# Patient Record
Sex: Female | Born: 1980 | Race: Black or African American | Hispanic: No | Marital: Single | Smoking: Never smoker
Health system: Southern US, Community
[De-identification: ages and names within clinical notes are randomized; demographics above are authoritative.]

## PROBLEM LIST (undated history)

## (undated) HISTORY — PX: TUBAL LIGATION: SHX77

---

## 1999-07-24 ENCOUNTER — Other Ambulatory Visit: Admission: RE | Admit: 1999-07-24 | Discharge: 1999-07-24 | Payer: Self-pay | Admitting: Obstetrics and Gynecology

## 1999-12-15 ENCOUNTER — Emergency Department (HOSPITAL_COMMUNITY): Admission: EM | Admit: 1999-12-15 | Discharge: 1999-12-15 | Payer: Self-pay

## 2000-04-03 ENCOUNTER — Emergency Department (HOSPITAL_COMMUNITY): Admission: EM | Admit: 2000-04-03 | Discharge: 2000-04-03 | Payer: Self-pay | Admitting: Emergency Medicine

## 2000-04-03 ENCOUNTER — Encounter: Payer: Self-pay | Admitting: Emergency Medicine

## 2003-05-06 ENCOUNTER — Emergency Department (HOSPITAL_COMMUNITY): Admission: EM | Admit: 2003-05-06 | Discharge: 2003-05-06 | Payer: Self-pay | Admitting: Emergency Medicine

## 2003-07-13 ENCOUNTER — Ambulatory Visit (HOSPITAL_COMMUNITY): Admission: RE | Admit: 2003-07-13 | Discharge: 2003-07-13 | Payer: Self-pay | Admitting: Obstetrics and Gynecology

## 2003-10-17 ENCOUNTER — Ambulatory Visit (HOSPITAL_COMMUNITY): Admission: AD | Admit: 2003-10-17 | Discharge: 2003-10-17 | Payer: Self-pay | Admitting: Obstetrics and Gynecology

## 2004-01-26 ENCOUNTER — Inpatient Hospital Stay (HOSPITAL_COMMUNITY): Admission: RE | Admit: 2004-01-26 | Discharge: 2004-01-28 | Payer: Self-pay | Admitting: Obstetrics & Gynecology

## 2004-05-29 ENCOUNTER — Emergency Department (HOSPITAL_COMMUNITY): Admission: EM | Admit: 2004-05-29 | Discharge: 2004-05-29 | Payer: Self-pay | Admitting: Emergency Medicine

## 2005-10-05 ENCOUNTER — Ambulatory Visit (HOSPITAL_COMMUNITY): Admission: RE | Admit: 2005-10-05 | Discharge: 2005-10-05 | Payer: Self-pay | Admitting: Obstetrics

## 2005-10-20 ENCOUNTER — Emergency Department (HOSPITAL_COMMUNITY): Admission: EM | Admit: 2005-10-20 | Discharge: 2005-10-20 | Payer: Self-pay | Admitting: *Deleted

## 2005-10-20 ENCOUNTER — Emergency Department (HOSPITAL_COMMUNITY): Admission: EM | Admit: 2005-10-20 | Discharge: 2005-10-20 | Payer: Self-pay | Admitting: Emergency Medicine

## 2006-02-27 ENCOUNTER — Inpatient Hospital Stay (HOSPITAL_COMMUNITY): Admission: AD | Admit: 2006-02-27 | Discharge: 2006-03-02 | Payer: Self-pay | Admitting: Obstetrics

## 2006-10-13 ENCOUNTER — Emergency Department (HOSPITAL_COMMUNITY): Admission: EM | Admit: 2006-10-13 | Discharge: 2006-10-13 | Payer: Self-pay | Admitting: Emergency Medicine

## 2007-02-22 ENCOUNTER — Other Ambulatory Visit: Admission: RE | Admit: 2007-02-22 | Discharge: 2007-02-22 | Payer: Self-pay | Admitting: Obstetrics and Gynecology

## 2007-07-15 ENCOUNTER — Encounter: Payer: Self-pay | Admitting: Obstetrics and Gynecology

## 2007-07-15 ENCOUNTER — Ambulatory Visit: Payer: Self-pay | Admitting: Obstetrics and Gynecology

## 2007-07-15 ENCOUNTER — Inpatient Hospital Stay (HOSPITAL_COMMUNITY): Admission: AD | Admit: 2007-07-15 | Discharge: 2007-07-17 | Payer: Self-pay | Admitting: Obstetrics and Gynecology

## 2007-09-07 ENCOUNTER — Ambulatory Visit (HOSPITAL_COMMUNITY): Admission: RE | Admit: 2007-09-07 | Discharge: 2007-09-07 | Payer: Self-pay | Admitting: Obstetrics & Gynecology

## 2007-12-17 ENCOUNTER — Emergency Department (HOSPITAL_COMMUNITY): Admission: EM | Admit: 2007-12-17 | Discharge: 2007-12-17 | Payer: Self-pay | Admitting: Emergency Medicine

## 2009-09-28 IMAGING — CR DG HIP COMPLETE 2+V*R*
3 series · 3 of 3 positions shown · non-contrast
Comparison: none

CLINICAL DATA: fall

RIGHT HIP - COMPLETE 2+ VIEW

[view not recorded (1 of 3)]
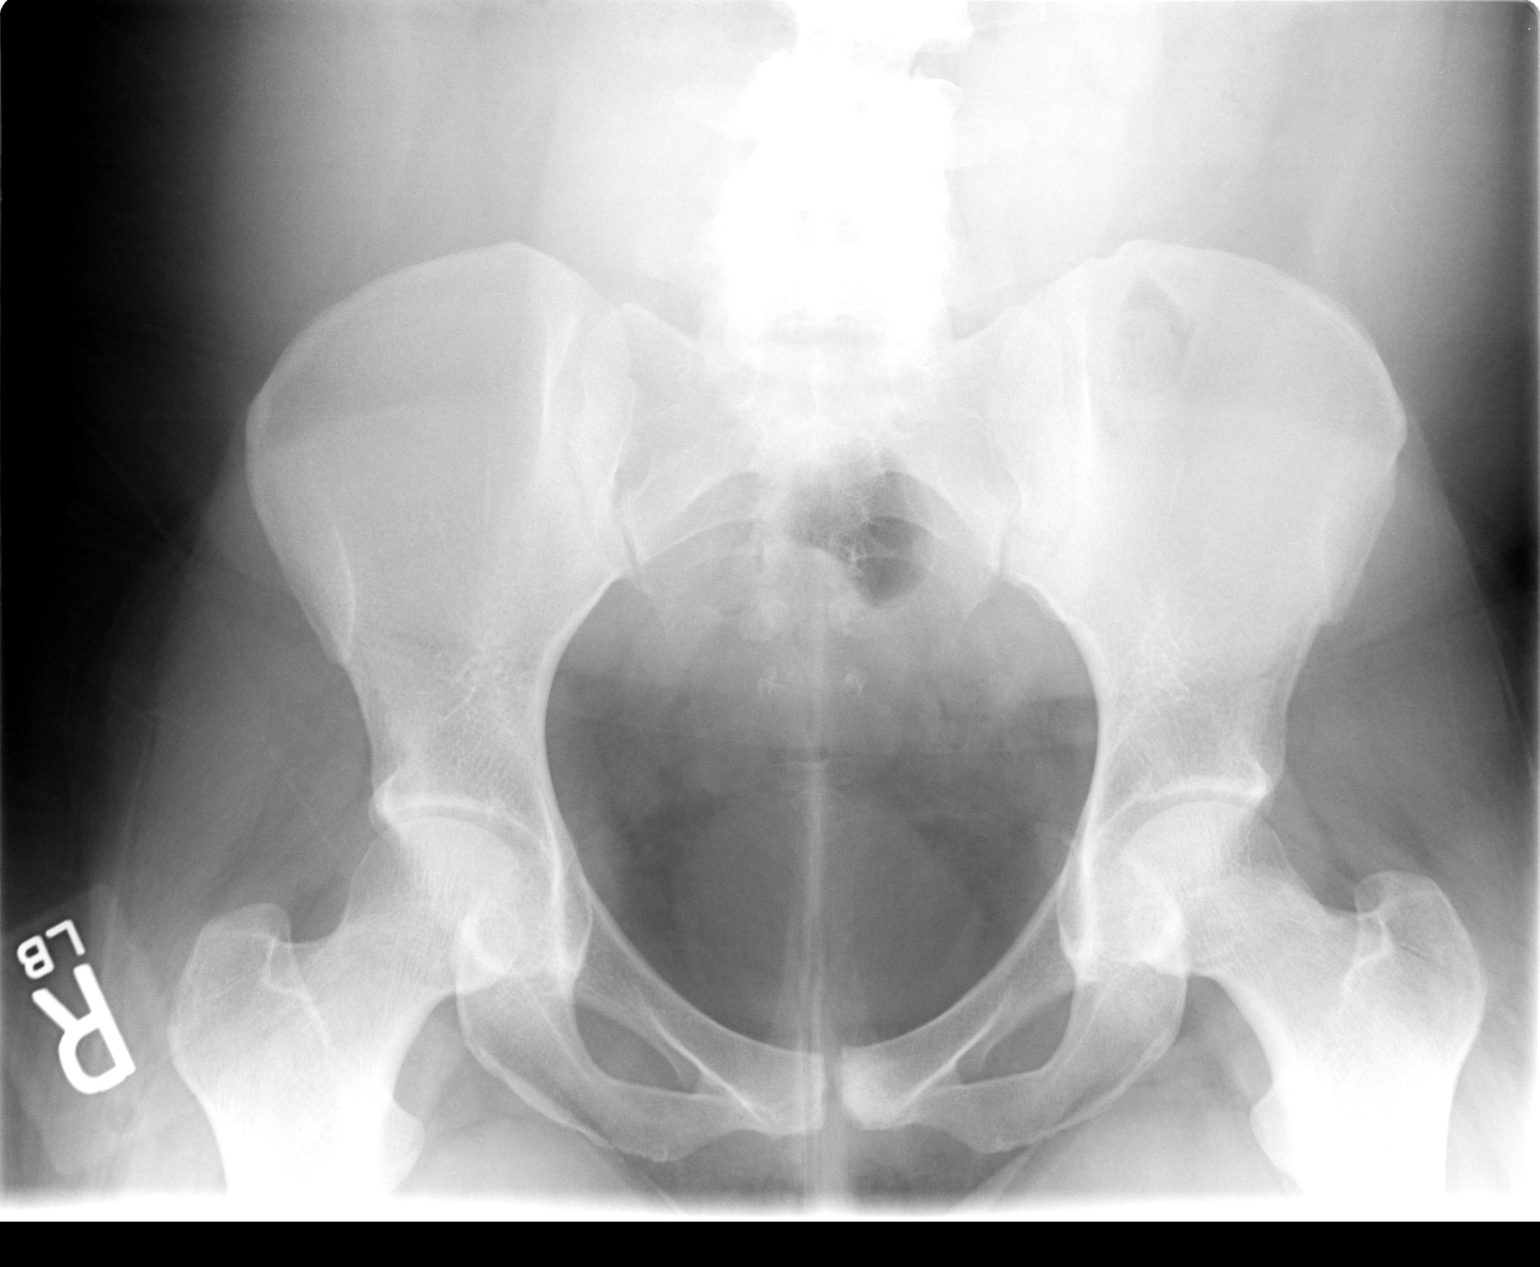

[view not recorded (2 of 3)]
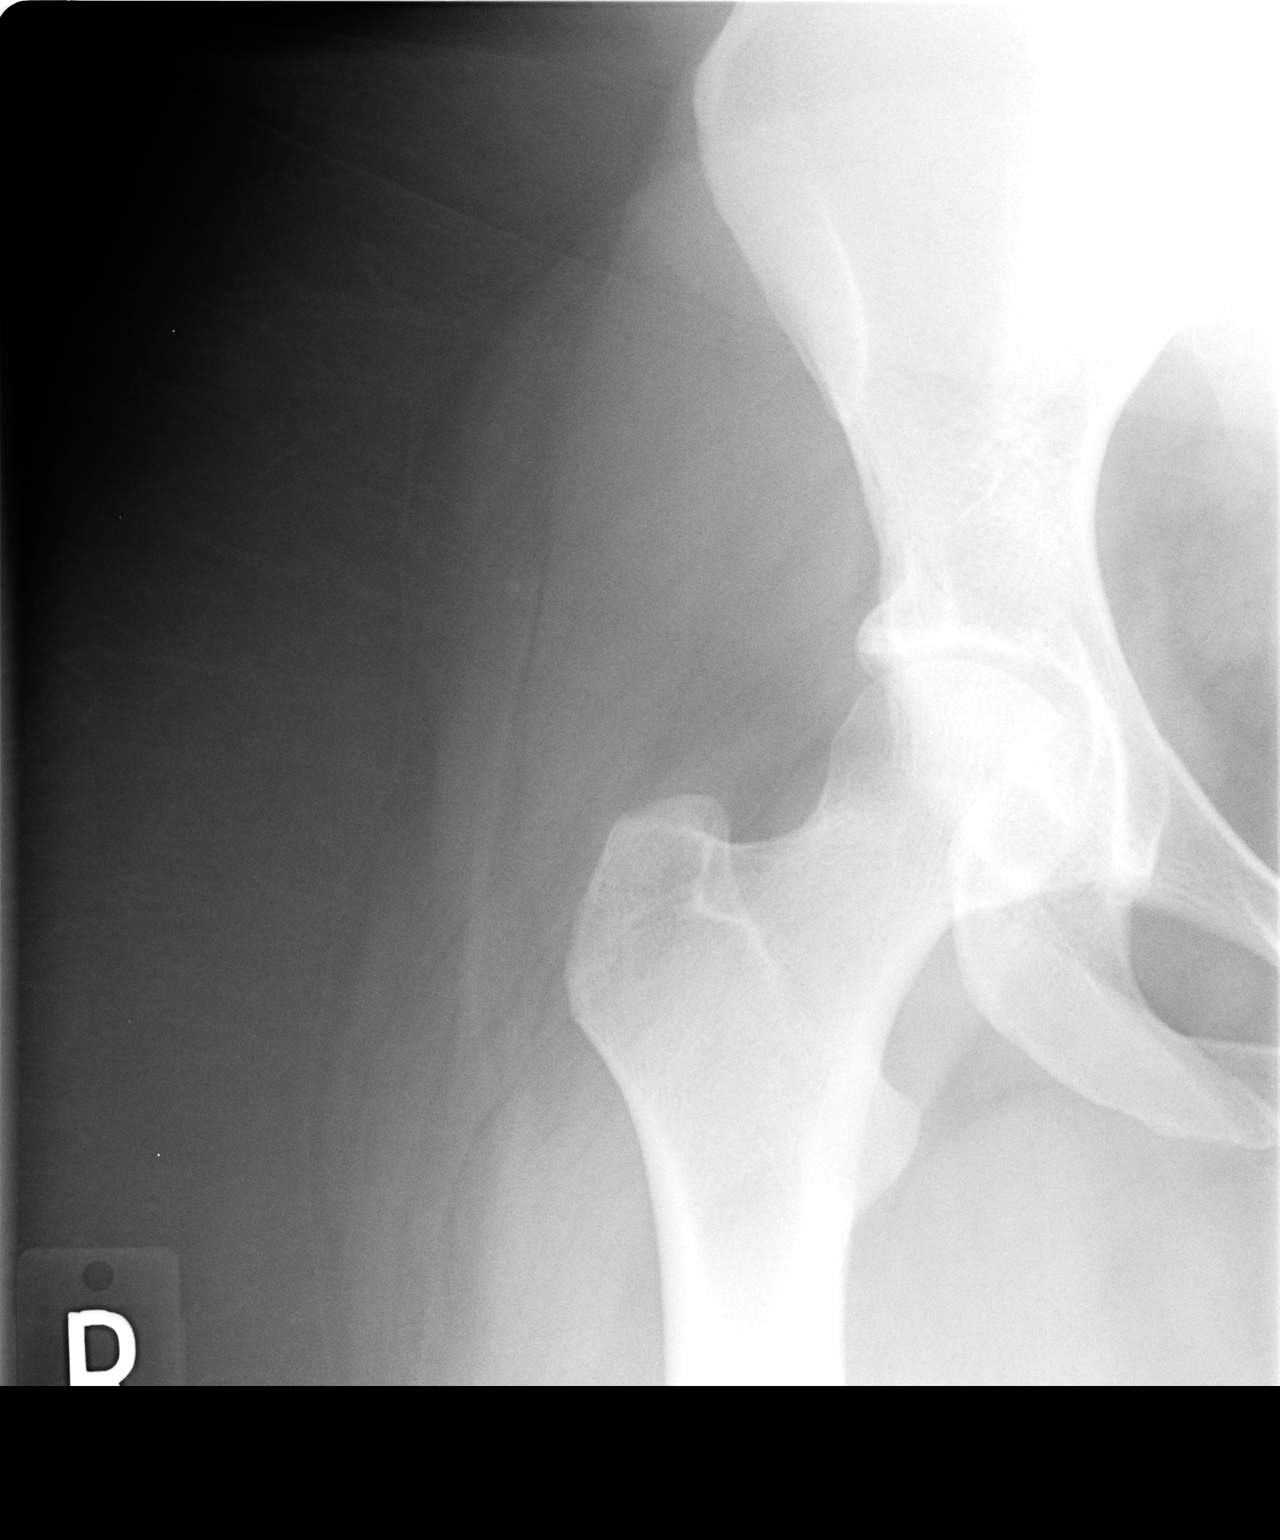

[view not recorded (3 of 3)]
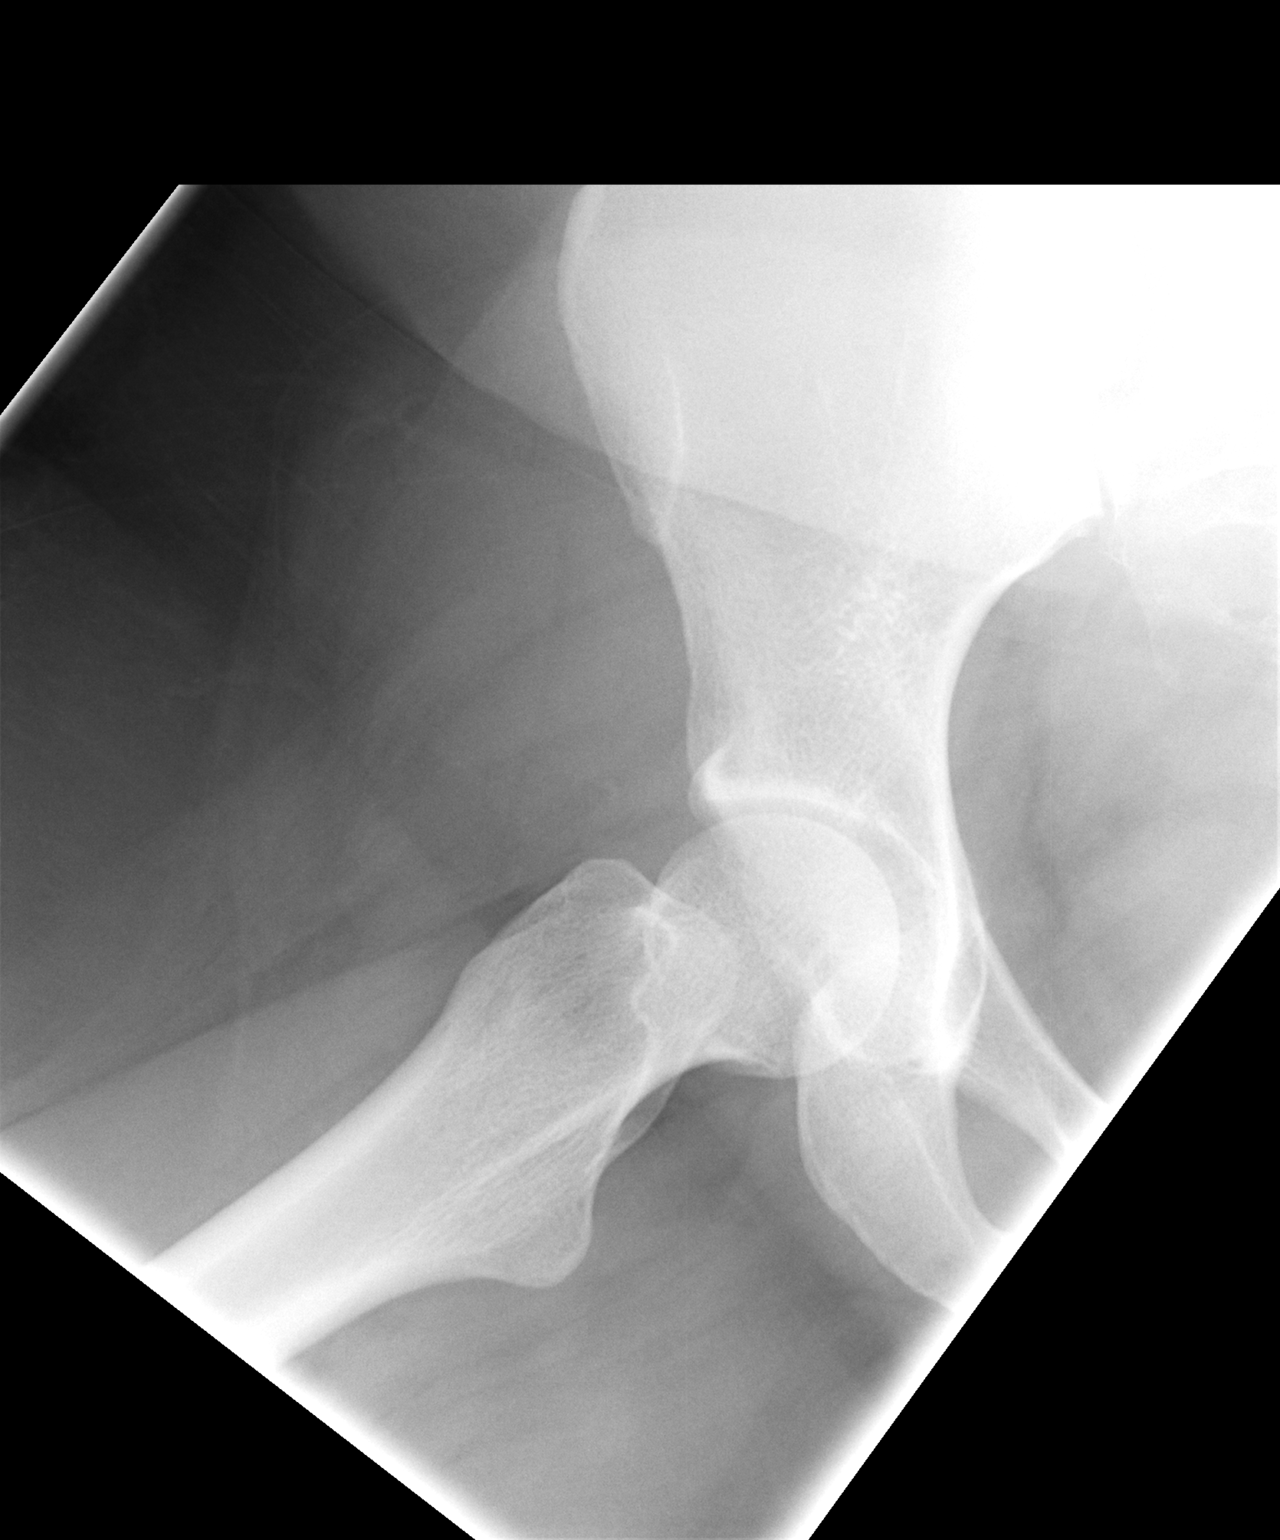

[3 of 3 positions shown; findings below may reference images not displayed]

FINDINGS: There is no evidence of hip fracture or dislocation.
There is no evidence of arthropathy or other focal bone
abnormality.
IMPRESSION: Negative.

## 2010-04-12 ENCOUNTER — Emergency Department (HOSPITAL_COMMUNITY)
Admission: EM | Admit: 2010-04-12 | Discharge: 2010-04-12 | Payer: Self-pay | Source: Home / Self Care | Admitting: Emergency Medicine

## 2010-09-09 NOTE — Op Note (Signed)
Jennifer Reilly, Jennifer Reilly               ACCOUNT NO.:  [id number]   MEDICAL RECORD NO.:  [id number]          PATIENT TYPE:  AMB   LOCATION:  DAY                           FACILITY:  [facility]   PHYSICIAN:  Lazaro Arms, M.D.   DATE OF BIRTH:  02-10-1981   DATE OF PROCEDURE:  09/07/2007  DATE OF DISCHARGE:                               OPERATIVE REPORT   PREOPERATIVE DIAGNOSIS:  Multiparous female desiring permanent  sterilization.   POSTOPERATIVE DIAGNOSIS:  Multiparous female desiring permanent  sterilization.   PROCEDURE:  Laparoscopic tubal ligation.   SURGEON:  Lazaro Arms, MD   ANESTHESIA:  General endotracheal.   FINDINGS:  The patient had normal uterus, tubes, and ovaries.   DESCRIPTION OF OPERATION:  The patient was taken to the operating room,  placed in supine position where she underwent general endotracheal  anesthesia.  She was placed then in the lithotomy position, prepped and  draped in the usual sterile fashion.  A Hulka tenaculum was placed in  the uterus for uterine manipulation.  Incision was made in the  umbilicus.  A Veress needle was employed.  Peritoneal cavity was  insufflated without difficulty.  A 10/11 nonbladed direct visualization  trocar was used and the distal grip of the trocar was placed in the  peritoneal cavity at one pass without difficulty.  The peritoneal cavity  was confirmed.  Fallopian tubes were identified.  They were burned  bilaterally.  No resistance and beyond in the distal isthmic ampullary  region of the tube approximately 3 cm segment bilaterally.  There was  good hemostasis.  There were no other abnormalities of the uterus,  tubes, or ovaries.   The instruments were removed.  The gas was allowed to escape.  The  fascia was closed using 0 Vicryl.  A subcutaneous 0 Vicryl was then  placed and the skin was closed using skin staples.  The patient  tolerated the procedure well.  She experienced no blood loss and was  taken to recovery  room in good stable condition.  All counts were  correct x3.      Lazaro Arms, M.D.  Electronically Signed     LHE/MEDQ  D:  09/07/2007  T:  09/08/2007  Job:  [id number]

## 2010-09-09 NOTE — H&P (Signed)
NAMEKOMAL, STANGELO               ACCOUNT NO.:  [id number]   MEDICAL RECORD NO.:  [id number]          PATIENT TYPE:  INP   LOCATION:  9146                          FACILITY:  [facility]   PHYSICIAN:  Lazaro Arms, M.D.   DATE OF BIRTH:  Sep 17, 1980   DATE OF ADMISSION:  07/15/2007  DATE OF DISCHARGE:                              HISTORY & PHYSICAL   Jennifer Reilly is a 30 year old African-American female, gravida 6, para 3-0-2-3  with twins, estimated date of delivery of August 13, 2007.  She presented  to the maternity admissions unit complaining of rupture of membranes and  labor.  She was found to be 7 cm in full breech presentation.  Ironically, I had seen her in the office earlier today, and she was  having no problems at all, no contractions, no nothing, and we scheduled  her for a cesarean section on April 2, along with a tubal ligation.  However she, of course, had other plans.  She was then taken immediately  to labor and delivery, where she had an IV started and labs drawn, and  preparations were made for immediate C-section.  Please refer to my  delivery note for details of that.   PAST MEDICAL HISTORY:  Negative.   PAST SURGICAL HISTORY:  Negative.   PAST OB HISTORY:  Two pregnancy losses and three term deliveries, 6  pounds 4 ounces, 6 pounds 7 ounces, 7 pounds 12 ounces.  This pregnancy  had been complicated by the fact that she had twins but otherwise was  relatively unremarkable.  Blood type was A positive.  Rubella is immune.  Sickle cell was negative.  RPR was negative x2.  Hepatitis B was  negative.  HIV was negative.  Glucola was normal.  Drug screen was  negative.  Pap was negative.  GC and Chlamydia were negative x2.  Group  B strep was pending.   PHYSICAL EXAMINATION:  HEENT:  Unremarkable.  Thyroid is normal.  LUNGS:  Clear.  HEART:  Regular rhythm without murmurs, rubs or gallops.  BREASTS:  Deferred.  ABDOMEN:  Fundal height in the office earlier today was 46 cm.   Cervix  again 7 cm with feet in the vagina.  EXTREMITIES:  Warm with no edema.  NEUROLOGIC:  Grossly intact.   IMPRESSION:  1. Intrauterine pregnancy with twins at 36-1/[redacted] weeks gestation.  2. Rupture of membranes with labor.   PLAN:  We will proceed immediately with cesarean section and tubal  ligation as planned.     Lazaro Arms, M.D.  Electronically Signed    LHE/MEDQ  D:  07/15/2007  T:  07/15/2007  Job:  [id number]

## 2010-09-09 NOTE — Op Note (Signed)
[name], [name]               ACCOUNT NO.:  [id number]   MEDICAL RECORD NO.:  [id number]          PATIENT TYPE:  INP   LOCATION:  9146                          FACILITY:  [facility]   PHYSICIAN:  Lazaro Arms, M.D.   DATE OF BIRTH:  [birth year]   DATE OF PROCEDURE:  07/15/2007  DATE OF DISCHARGE:                               OPERATIVE REPORT   HISTORY:  Jennifer Reilly is a 30 year old African American female gravida 6,  para 3-0-2-3 with twin gestation, estimated date of delivery of August 13, 2007, currently at 19 and 3 days weeks gestation who presented to  the maternity admissions unit complaining of ruptured membranes while  she was getting her nails done.  She then presented to the maternity  admissions unit and was evaluated by Sherril Cong, CNM and she was  found to be indeed ruptured, 7 cm with a double footling breech  presentation.  We then made rapid preparations to proceed with a C-  section.  I had actually scheduled her for a C-section when I saw her in  the office earlier today for April 2 because she was breech transverse  twins.  We quickly brought her back to the operating room but she had a  tremendous amount of pressure, was examined and felt the strong urge to  push and so went ahead and did a double footling breech delivery of twin  A.  She was a female at 2000 hours with Apgars of 8 and 9 with a weight  determined in the nursery.  The cord gas was 7.39.  I then used the  ultrasound and found the heart rate of twin B.  Twin B had a heart rate  around 100 and it quickly came up to the 130s.  When she had a  contraction I did an amniotomy and twin B was in vertex presentation.  I  delivered twin B which was a female at 2008 hours with Apgars of 9 and 10  and cord gas of 7.30.  His weight will also be determined in the  nursery.  Dr. Alison Murray was in attendance for the delivery of twin B and  was there shortly after the delivery of twin A.  All this occurred in  the operating  room in room 1 with the OR staff in place as we were  making preparations for a C-section.  Placentas were fused.  They were  delivered spontaneously at 2015 hours intact and they were sent to  pathology for evaluation.  Cord blood was also sent on both twins.  The  perineum was intact.  The uterus was firm below the umbilicus and  bleeding was minimal for the delivery.  The blood type is A positive,  antibody is negative, rubella is immune, the rest of the prenatal labs  were normal.   Deaisha wants to have a tubal ligation and I will see if her papers have  been signed long enough, I believe they have.  If they are then we will  do that on Sunday.  Otherwise she will undergo routine postpartum care.  Lazaro Arms, M.D.  Electronically Signed     LHE/MEDQ  D:  07/15/2007  T:  07/16/2007  Job:  [id number]

## 2010-09-12 NOTE — Op Note (Signed)
Jennifer Reilly, Jennifer Reilly               ACCOUNT NO.:  [id number]   MEDICAL RECORD NO.:  [id number]          PATIENT TYPE:  INP   LOCATION:  A401                          FACILITY:  [facility]   PHYSICIAN:  Lazaro Arms, M.D.   DATE OF BIRTH:  1980-09-24   DATE OF PROCEDURE:  01/26/2004  DATE OF DISCHARGE:                                 OPERATIVE REPORT   DELIVERY NOTE:  The patient progressed nicely through the active phase of  labor.  She delivered over an intact perineum a viable female at 1435 hours,  with Apgars of 9 and 9, with the weight to be determined.  The cord blood  and cord gas were sent.  There was a three-vessel cord.  The placenta was  normal and intact.  The uterus was firm and below the umbilicus.  There was  a second-degree perineal laceration which was repaired without difficulty.  The blood loss for the delivery is minimal.   PLAN:  She will undergo routine post-partum care.      LHE/MEDQ  D:  01/26/2004  T:  01/27/2004  Job:  [id number]

## 2010-09-12 NOTE — H&P (Signed)
[name], Reilly               ACCOUNT NO.:  [id number]   MEDICAL RECORD NO.:  [id number]          PATIENT TYPE:  INP   LOCATION:  LDR2                          FACILITY:  [facility]   PHYSICIAN:  Lazaro Arms, M.D.   DATE OF BIRTH:  03-12-1981   DATE OF ADMISSION:  01/26/2004  DATE OF DISCHARGE:  LH                                HISTORY & PHYSICAL   HISTORY OF PRESENT ILLNESS:  Jennifer Reilly is a 30 year old African-American  female, gravida 3, para 1, abortus 1, with estimated date of delivery of  February 10, 2004, currently at 37-6/[redacted] weeks gestation, who is admitted, came  into labor and delivery complaining of contractions.  Her cervix was 2 to 3  cm, 50%, -2.  She was left in labor and delivery and she has progressed now  to 3 to 4, 50%, and -2.  She is having pretty irregular contractions.  Amniotomy is performed.  Pitocin augmentation is begun.  The patient is  positive for herpes simplex virus 2 antibodies.  Saw her yesterday in the  office.  She is having no symptoms, nor has she ever had any symptoms, and  during the pregnancy she has been taking Valtrex as suppression at 500 mg a  day.   PAST MEDICAL HISTORY:  Negative except for migraines.   PAST SURGICAL HISTORY:  Negative.   PAST OBSTETRICAL HISTORY:  1.  She had a termination in August 2004.  2.  She had a vaginal delivery in 1995, a 6 pound 4 ounce baby in      [place].   REVIEW OF SYSTEMS:  Otherwise negative.   ALLERGIES:  No known drug allergies.   LABORATORY DATA:  Blood type is A positive.  Drug screen was negative.  Antibody screen was negative.  Rubella is immune.  Hepatitis B was negative.  HIV was negative.  Serologies non-reactive.  Her Pap was normal.  GC and  Chlamydia were negative.  Her AFP was normal.  Group B Strep was negative.  Her Glucola was 88.  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   PHYSICAL EXAMINATION:  HEENT:  Unremarkable.  NECK:  Thyroid is normal.  LUNGS:  Clear.  HEART:  Regular rate and rhythm without murmurs, rubs, or gallops.  BREASTS:  Deferred.  ABDOMEN:  Fundal height in the office was 38 cm.  PELVIC:  Cervix 3 to 4, 50, -2.  EXTREMITIES:  Warm with 1+ edema.   IMPRESSION:  1.  Intrauterine pregnancy at 37-6/[redacted] weeks gestation.  2.  Early active labor.   PLAN:  The patient is admitted for labor management.  Amniotomy is performed  with clear fluid.  Scalp electrode is placed.  She will be begun on Pitocin  augmentation for labor.      LHE/MEDQ  D:  01/26/2004  T:  01/26/2004  Job:  [id number]

## 2011-01-19 LAB — CBC
HCT: 29.8 — ABNORMAL LOW
MCHC: 32.3
MCV: 71.1 — ABNORMAL LOW
Platelets: 151
RBC: 4.19
WBC: 7.8

## 2013-05-04 ENCOUNTER — Encounter (HOSPITAL_COMMUNITY): Payer: Self-pay | Admitting: Emergency Medicine

## 2013-05-04 ENCOUNTER — Emergency Department (HOSPITAL_COMMUNITY)
Admission: EM | Admit: 2013-05-04 | Discharge: 2013-05-04 | Disposition: A | Discharge status: Home / Self Care | Payer: Managed Care, Other (non HMO) | Attending: Emergency Medicine | Admitting: Emergency Medicine

## 2013-05-04 DIAGNOSIS — J3489 Other specified disorders of nose and nasal sinuses: Secondary | ICD-10-CM | POA: Insufficient documentation

## 2013-05-04 DIAGNOSIS — J020 Streptococcal pharyngitis: Secondary | ICD-10-CM | POA: Insufficient documentation

## 2013-05-04 DIAGNOSIS — R0982 Postnasal drip: Secondary | ICD-10-CM | POA: Insufficient documentation

## 2013-05-04 LAB — RAPID STREP SCREEN (MED CTR MEBANE ONLY): STREPTOCOCCUS, GROUP A SCREEN (DIRECT): NEGATIVE

## 2013-05-04 MED ORDER — PENICILLIN G BENZATHINE 1200000 UNIT/2ML IM SUSP
1.2000 10*6.[IU] | Freq: Once | INTRAMUSCULAR | Status: AC
  Administered 2013-05-04: 1.2 10*6.[IU] via INTRAMUSCULAR
  Filled 2013-05-04: qty 2

## 2013-05-04 MED ORDER — DEXAMETHASONE SODIUM PHOSPHATE 10 MG/ML IJ SOLN
10.0000 mg | Freq: Once | INTRAMUSCULAR | Status: AC
  Administered 2013-05-04: 10 mg via INTRAMUSCULAR
  Filled 2013-05-04: qty 1

## 2013-05-04 NOTE — ED Notes (Signed)
Vital signs stable. 

## 2013-05-04 NOTE — ED Provider Notes (Signed)
CSN: [id number]     Arrival date & time 05/04/13  0449 History   First MD Initiated Contact with Patient 05/04/13 0601     Chief Complaint  Patient presents with  . Sore Throat  . Nasal Congestion   (Consider location/radiation/quality/duration/timing/severity/associated sxs/prior Treatment) Patient is a 33 y.o. female presenting with pharyngitis. The history is provided by the patient and medical records.  Sore Throat Associated symptoms include a sore throat.   This is a 33 y.o. F with no significant PMH presenting to the ED for sore throat, nasal congestion, rhinorrhea, and painful swallowing x 4 days.  Pt denies fever, sweats, or chills.  No recent sick contacts with similar sx.  No difficulty swallowing or breathing.  Pt states she has a hx of strep throat with similar sx.  VS stable on arrival.  History reviewed. No pertinent past medical history. Past Surgical History  Procedure Laterality Date  . Tubal ligation     History reviewed. No pertinent family history. History  Substance Use Topics  . Smoking status: Never Smoker   . Smokeless tobacco: Not on file  . Alcohol Use: Yes     Comment: social   OB History   Grav Para Term Preterm Abortions TAB SAB Ect Mult Living                 Review of Systems  HENT: Positive for postnasal drip and sore throat.   All other systems reviewed and are negative.    Allergies  Review of patient's allergies indicates no known allergies.  Home Medications   Current Outpatient Rx  Name  Route  Sig  Dispense  Refill  . Chlorphen-Pseudoephed-APAP (THERAFLU FLU/COLD/SORE THROAT PO)   Oral   Take 1 Package by mouth every 4 (four) hours as needed (cold).         . Pseudoephedrine-APAP-DM (DAYQUIL MULTI-SYMPTOM COLD/FLU PO)   Oral   Take 2 capsules by mouth every 4 (four) hours as needed (cold).          BP 129/77  Temp(Src) 98 F (36.7 C) (Oral)  Resp 20  SpO2 95%  LMP 04/09/2013  Physical Exam  Nursing note and  vitals reviewed. Constitutional: She is oriented to person, place, and time. She appears well-developed and well-nourished. No distress.  HENT:  Head: Normocephalic and atraumatic.  Right Ear: Tympanic membrane and ear canal normal.  Left Ear: Tympanic membrane and ear canal normal.  Nose: Mucosal edema present.  Mouth/Throat: Uvula is midline and mucous membranes are normal. No trismus in the jaw. No uvula swelling. Posterior oropharyngeal erythema present. No oropharyngeal exudate, posterior oropharyngeal edema or tonsillar abscesses.  Turbinates swollen and erythematous; tonsils 2+ bilaterally without exudate; uvula midline; no peritonsillar abscess; handling secretions appropriately; no difficulty swallowing  Eyes: Conjunctivae and EOM are normal. Pupils are equal, round, and reactive to light.  Neck: Normal range of motion. Neck supple.  Cardiovascular: Normal rate, regular rhythm and normal heart sounds.   Pulmonary/Chest: Effort normal and breath sounds normal. No respiratory distress. She has no wheezes.  Musculoskeletal: Normal range of motion.  Lymphadenopathy:    She has cervical adenopathy.  Neurological: She is alert and oriented to person, place, and time.  Skin: Skin is warm and dry. She is not diaphoretic.  Psychiatric: She has a normal mood and affect.    ED Course  Procedures (including critical care time) Labs Review Labs Reviewed  RAPID STREP SCREEN  CULTURE, GROUP A STREP   Imaging Review  No results found.  EKG Interpretation   None       MDM   1. Strep pharyngitis    Rapid strep negative, however sx and PE findings consistent with strep.  Uvula remains midline, no signs of peritonsillar abscess.  Will tx with bicillin and decadron in the ED.  Return precautions advised for new or worsening symptoms.  Garlon HatchetLisa M Mauriah Mcmillen, PA-C 05/04/13 [phone]

## 2013-05-04 NOTE — Discharge Instructions (Signed)
You have been treated for strep pharyngitis.  Your symptoms should start to improve in the next 24-48 hours. Return to the ED for new or worsening symptoms.

## 2013-05-04 NOTE — ED Notes (Signed)
Pt c/o sore throat, nasal congestion and draining, and increase pain with swallowing x4 days. Redness noted to throat

## 2013-05-04 NOTE — ED Notes (Signed)
Pt c/o sore throat, nasal congestion and pain with swallowing x4 days. Pt denies cough, congestion, fever, or chills

## 2013-05-05 LAB — CULTURE, GROUP A STREP

## 2013-05-05 NOTE — ED Provider Notes (Signed)
Medical screening examination/treatment/procedure(s) were performed by non-physician practitioner and as supervising physician I was immediately available for consultation/collaboration.  EKG Interpretation   None         Loren Raceravid Briselda Naval, MD 05/05/13 [id number]

## 2013-05-06 ENCOUNTER — Emergency Department (HOSPITAL_COMMUNITY)
Admission: EM | Admit: 2013-05-06 | Discharge: 2013-05-06 | Disposition: A | Discharge status: Home / Self Care | Payer: Managed Care, Other (non HMO) | Attending: Emergency Medicine | Admitting: Emergency Medicine

## 2013-05-06 ENCOUNTER — Encounter (HOSPITAL_COMMUNITY): Payer: Self-pay | Admitting: Emergency Medicine

## 2013-05-06 DIAGNOSIS — J069 Acute upper respiratory infection, unspecified: Secondary | ICD-10-CM | POA: Insufficient documentation

## 2013-05-06 DIAGNOSIS — R5381 Other malaise: Secondary | ICD-10-CM | POA: Insufficient documentation

## 2013-05-06 DIAGNOSIS — R5383 Other fatigue: Secondary | ICD-10-CM

## 2013-05-06 DIAGNOSIS — J029 Acute pharyngitis, unspecified: Secondary | ICD-10-CM

## 2013-05-06 MED ORDER — PREDNISONE 20 MG PO TABS
60.0000 mg | ORAL_TABLET | Freq: Once | ORAL | Status: AC
  Administered 2013-05-06: 60 mg via ORAL
  Filled 2013-05-06: qty 3

## 2013-05-06 MED ORDER — LIDOCAINE VISCOUS 2 % MT SOLN
15.0000 mL | Freq: Once | OROMUCOSAL | Status: AC
  Administered 2013-05-06: 15 mL via OROMUCOSAL
  Filled 2013-05-06: qty 15

## 2013-05-06 MED ORDER — DIPHENHYDRAMINE HCL 12.5 MG/5ML PO ELIX
25.0000 mg | ORAL_SOLUTION | Freq: Once | ORAL | Status: AC
  Administered 2013-05-06: 25 mg via ORAL
  Filled 2013-05-06: qty 10

## 2013-05-06 MED ORDER — GUAIFENESIN ER 600 MG PO TB12: 1200.0000 mg | ORAL_TABLET | Freq: Two times a day (BID) | ORAL | Status: AC

## 2013-05-06 MED ORDER — CETIRIZINE-PSEUDOEPHEDRINE ER 5-120 MG PO TB12: 1.0000 | ORAL_TABLET | Freq: Two times a day (BID) | ORAL | Status: AC

## 2013-05-06 NOTE — Discharge Instructions (Signed)
Use frequent salt water gargles to help with sore throat symptoms. Use other medications prescribed to help with congestion, sore throat and cough. Follow up with a primary care provider for continued evaluation and treatment.    Pharyngitis Pharyngitis is redness, pain, and swelling (inflammation) of your pharynx.  CAUSES  Pharyngitis is usually caused by infection. Most of the time, these infections are from viruses (viral) and are part of a cold. However, sometimes pharyngitis is caused by bacteria (bacterial). Pharyngitis can also be caused by allergies. Viral pharyngitis may be spread from person to person by coughing, sneezing, and personal items or utensils (cups, forks, spoons, toothbrushes). Bacterial pharyngitis may be spread from person to person by more intimate contact, such as kissing.  SIGNS AND SYMPTOMS  Symptoms of pharyngitis include:   Sore throat.   Tiredness (fatigue).   Low-grade fever.   Headache.  Joint pain and muscle aches.  Skin rashes.  Swollen lymph nodes.  Plaque-like film on throat or tonsils (often seen with bacterial pharyngitis). DIAGNOSIS  Your health care provider will ask you questions about your illness and your symptoms. Your medical history, along with a physical exam, is often all that is needed to diagnose pharyngitis. Sometimes, a rapid strep test is done. Other lab tests may also be done, depending on the suspected cause.  TREATMENT  Viral pharyngitis will usually get better in 3 4 days without the use of medicine. Bacterial pharyngitis is treated with medicines that kill germs (antibiotics).  HOME CARE INSTRUCTIONS   Drink enough water and fluids to keep your urine clear or pale yellow.   Only take over-the-counter or prescription medicines as directed by your health care provider:   If you are prescribed antibiotics, make sure you finish them even if you start to feel better.   Do not take aspirin.   Get lots of rest.    Gargle with 8 oz of salt water ( tsp of salt per 1 qt of water) as often as every 1 2 hours to soothe your throat.   Throat lozenges (if you are not at risk for choking) or sprays may be used to soothe your throat. SEEK MEDICAL CARE IF:   You have large, tender lumps in your neck.  You have a rash.  You cough up green, yellow-brown, or bloody spit. SEEK IMMEDIATE MEDICAL CARE IF:   Your neck becomes stiff.  You drool or are unable to swallow liquids.  You vomit or are unable to keep medicines or liquids down.  You have severe pain that does not go away with the use of recommended medicines.  You have trouble breathing (not caused by a stuffy nose). MAKE SURE YOU:   Understand these instructions.  Will watch your condition.  Will get help right away if you are not doing well or get worse. Document Released: 04/13/2005 Document Revised: 02/01/2013 Document Reviewed: 12/19/2012 [facility] Patient Information 2014 [place], [place].   Salt Water Gargle This solution will help make your mouth and throat feel better. HOME CARE INSTRUCTIONS   Mix 1 teaspoon of salt in 8 ounces of warm water.  Gargle with this solution as much or often as you need or as directed. Swish and gargle gently if you have any sores or wounds in your mouth.  Do not swallow this mixture. Document Released: 01/16/2004 Document Revised: 07/06/2011 Document Reviewed: 06/08/2008 [facility] Patient Information 2014 [place], [place].    Upper Respiratory Infection, Adult An upper respiratory infection (URI) is also known as the  common cold. It is often caused by a type of germ (virus). Colds are easily spread (contagious). You can pass it to others by kissing, coughing, sneezing, or drinking out of the same glass. Usually, you get better in 1 or 2 weeks.  HOME CARE   Only take medicine as told by your doctor.  Use a warm mist humidifier or breathe in steam from a hot shower.  Drink enough water and  fluids to keep your pee (urine) clear or pale yellow.  Get plenty of rest.  Return to work when your temperature is back to normal or as told by your doctor. You may use a face mask and wash your hands to stop your cold from spreading. GET HELP RIGHT AWAY IF:   After the first few days, you feel you are getting worse.  You have questions about your medicine.  You have chills, shortness of breath, or brown or red spit (mucus).  You have yellow or brown snot (nasal discharge) or pain in the face, especially when you bend forward.  You have a fever, puffy (swollen) neck, pain when you swallow, or white spots in the back of your throat.  You have a bad headache, ear pain, sinus pain, or chest pain.  You have a high-pitched whistling sound when you breathe in and out (wheezing).  You have a lasting cough or cough up blood.  You have sore muscles or a stiff neck. MAKE SURE YOU:   Understand these instructions.  Will watch your condition.  Will get help right away if you are not doing well or get worse. Document Released: 09/30/2007 Document Revised: 07/06/2011 Document Reviewed: 08/18/2010 [facility] Patient Information 2014 [place].

## 2013-05-06 NOTE — ED Provider Notes (Signed)
Medical screening examination/treatment/procedure(s) were performed by non-physician practitioner and as supervising physician I was immediately available for consultation/collaboration.  EKG Interpretation   None         Charles B. Bernette MayersSheldon, MD 05/06/13 2352

## 2013-05-06 NOTE — ED Notes (Signed)
Pt reports severe sore throat for several days, having right ear pain and congestion. Was seen here two days ago and diagnosed with strep, now having increase in pain, hoarseness, neck pain. Is able to swallow liquids but difficult to pain. Airway is intact at triage.

## 2013-05-06 NOTE — ED Provider Notes (Signed)
CSN: [id number]     Arrival date & time 05/06/13  1759 History   First MD Initiated Contact with Patient 05/06/13 2152     Chief Complaint  Patient presents with  . Sore Throat   HPI  History provided by the patient and recent medical chart. Patient is a 33 year old female with no significant PMH presenting with continued worsened sore throat. Patient reports having nasal congestion, rhinorrhea, sore throat for the past 6 days. She was seen 2 days ago in the emergency department with negative strep test and negative throat culture. She was given IM dose of Bicillin for possible strep throat. She has not noticed any change or improvement in symptoms since that time. She has not used any other medications at home.  No other treatments used.  Denies any associated fever, chills or sweats.    History reviewed. No pertinent past medical history. Past Surgical History  Procedure Laterality Date  . Tubal ligation     History reviewed. No pertinent family history. History  Substance Use Topics  . Smoking status: Never Smoker   . Smokeless tobacco: Not on file  . Alcohol Use: Yes     Comment: social   OB History   Grav Para Term Preterm Abortions TAB SAB Ect Mult Living                 Review of Systems  Constitutional: Positive for fatigue. Negative for fever, chills and diaphoresis.  HENT: Positive for congestion, ear pain, rhinorrhea and sore throat.   Respiratory: Positive for cough.   Gastrointestinal: Negative for nausea, vomiting and abdominal pain.  All other systems reviewed and are negative.    Allergies  Review of patient's allergies indicates no known allergies.  Home Medications   Current Outpatient Rx  Name  Route  Sig  Dispense  Refill  . cetirizine-pseudoephedrine (ZYRTEC-D) 5-120 MG per tablet   Oral   Take 1 tablet by mouth 2 (two) times daily.   30 tablet   0   . guaiFENesin (MUCINEX) 600 MG 12 hr tablet   Oral   Take 2 tablets (1,200 mg total) by mouth  2 (two) times daily.   20 tablet   0    BP 111/65  Pulse 98  Temp(Src) 98.1 F (36.7 C) (Oral)  Resp 18  SpO2 97%  LMP 04/09/2013 Physical Exam  Nursing note and vitals reviewed. Constitutional: She is oriented to person, place, and time. She appears well-developed and well-nourished. No distress.  HENT:  Head: Normocephalic.  Right Ear: Tympanic membrane normal.  Left Ear: Tympanic membrane normal.  Cobblestoning of the pharynx. No exudate. Tonsils normal without exudate. No erythema. Uvula midline. No signs concerning for PTA.  Nasal congestion present with mild edema of nasal mucosa. There is poor air movement through both nostrils.  Neck: Normal range of motion. Neck supple.  Cardiovascular: Normal rate and regular rhythm.   Pulmonary/Chest: Effort normal and breath sounds normal. No respiratory distress. She has no wheezes. She has no rales.  Abdominal: Soft.  Musculoskeletal: Normal range of motion.  Lymphadenopathy:    She has no cervical adenopathy.  Neurological: She is alert and oriented to person, place, and time.  Skin: Skin is warm and dry. No rash noted.  Psychiatric: She has a normal mood and affect. Her behavior is normal.    ED Course  Procedures   DIAGNOSTIC STUDIES: Oxygen Saturation is 97% on room air.    COORDINATION OF CARE:  Nursing notes reviewed.  Vital signs reviewed. Initial pt interview and examination performed.   10:03 PM-patient seen and evaluated. She is well-appearing no acute distress. Does not appear in significant discomfort. Does not appear severely ill or toxic. Patient was seen and treated with Bicillin. Negative strep test. Exam consistent with postnasal drainage and irritation of pharynx. Discussed work up plan with pt at bedside, which includes frequent saltwater gargles and over-the-counter medications for her nasal congestion. Pt agrees with plan.   Treatment plan initiated: Medications  diphenhydrAMINE (BENADRYL) 12.5 MG/5ML  elixir 25 mg (not administered)  predniSONE (DELTASONE) tablet 60 mg (not administered)  lidocaine (XYLOCAINE) 2 % viscous mouth solution 15 mL (not administered)     MDM   1. Pharyngitis   2. URI (upper respiratory infection)        Angus Seller, PA-C 05/06/13 2207

## 2015-06-19 ENCOUNTER — Other Ambulatory Visit: Payer: Self-pay | Admitting: Obstetrics & Gynecology

## 2015-06-19 ENCOUNTER — Other Ambulatory Visit (HOSPITAL_COMMUNITY)
Admission: RE | Admit: 2015-06-19 | Discharge: 2015-06-19 | Disposition: A | Discharge status: Home / Self Care | Payer: Managed Care, Other (non HMO) | Source: Ambulatory Visit | Attending: Obstetrics & Gynecology | Admitting: Obstetrics & Gynecology

## 2015-06-19 DIAGNOSIS — Z01419 Encounter for gynecological examination (general) (routine) without abnormal findings: Secondary | ICD-10-CM | POA: Diagnosis present

## 2015-06-19 DIAGNOSIS — R87810 Cervical high risk human papillomavirus (HPV) DNA test positive: Secondary | ICD-10-CM | POA: Diagnosis not present

## 2015-06-19 DIAGNOSIS — Z113 Encounter for screening for infections with a predominantly sexual mode of transmission: Secondary | ICD-10-CM | POA: Diagnosis present

## 2015-06-19 DIAGNOSIS — Z1151 Encounter for screening for human papillomavirus (HPV): Secondary | ICD-10-CM | POA: Insufficient documentation

## 2015-06-21 LAB — CYTOLOGY - PAP

## 2016-09-06 ENCOUNTER — Encounter (HOSPITAL_COMMUNITY): Payer: Self-pay | Admitting: Emergency Medicine

## 2016-09-06 ENCOUNTER — Emergency Department (HOSPITAL_COMMUNITY): Payer: 59

## 2016-09-06 ENCOUNTER — Emergency Department (HOSPITAL_COMMUNITY)
Admission: EM | Admit: 2016-09-06 | Discharge: 2016-09-06 | Disposition: A | Discharge status: Home / Self Care | Payer: 59 | Attending: Emergency Medicine | Admitting: Emergency Medicine

## 2016-09-06 DIAGNOSIS — M25561 Pain in right knee: Secondary | ICD-10-CM | POA: Insufficient documentation

## 2016-09-06 MED ORDER — MELOXICAM 7.5 MG PO TABS: 15.0000 mg | ORAL_TABLET | Freq: Every day | ORAL | 0 refills | Status: AC

## 2016-09-06 NOTE — ED Notes (Signed)
Pt verbalized understanding of d/c instructions and has no further questions. Pt is stable, A&Ox4, VSS.  

## 2016-09-06 NOTE — Discharge Instructions (Signed)
Take the prescribed medication as directed.  Can wear ace wrap for support when needed. Follow-up with Dr. Linna CapriceSwinteck (ortho on call) if you continue having issues.  Can call his office to make an appt. Return to the ED for new or worsening symptoms.

## 2016-09-06 NOTE — ED Provider Notes (Signed)
[facility]-EMERGENCY DEPT Provider Note   CSN: [id number] Arrival date & time: 09/06/16  0133     History   Chief Complaint Chief Complaint  Patient presents with  . Knee Pain    HPI Jennifer Reilly is a 36 y.o. female.  The history is provided by the patient and medical records.  Knee Pain      36 year old female with history of chronic right knee pain, presenting to the ED for worsening right knee pain.  Patient states she chronically has issues with the right knee, notably when walking or going up and down the stairs a lot. 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 States she was at work this evening when pain got more severe since he had to leave early. She reports a grinding sensation in her right knee when walking. She's not had any falls or trauma. States tonight she did feel like her knee was going to "give out" as well.  She denies any numbness or weakness of the right leg.  She is not having any issues in her left knee. States she saw an orthopedist several years ago, no recent follow-up. States her primary care doctor did prescribe her some medications, however it makes her very drowsy so she is unable to take it when at work.  History reviewed. No pertinent past medical history.  There are no active problems to display for this patient.   Past Surgical History:  Procedure Laterality Date  . TUBAL LIGATION      OB History    No data available       Home Medications    Prior to Admission medications   Medication Sig Start Date End Date Taking? Authorizing Provider  cetirizine-pseudoephedrine (ZYRTEC-D) 5-120 MG per tablet Take 1 tablet by mouth 2 (two) times daily. 05/06/13   Ivonne Andrew, PA-C  guaiFENesin (MUCINEX) 600 MG 12 hr tablet Take 2 tablets (1,200 mg total) by mouth 2 (two) times daily. 05/06/13   Ivonne Andrew, PA-C    Family History History reviewed. No pertinent family  history.  Social History Social History  Substance Use Topics  . Smoking status: Never Smoker  . Smokeless tobacco: Never Used  . Alcohol use Yes     Comment: social     Allergies   Patient has no known allergies.   Review of Systems Review of Systems  Musculoskeletal: Positive for arthralgias.  All other systems reviewed and are negative.    Physical Exam Updated Vital Signs BP 135/89 (BP Location: Left Arm)   Pulse 79   Temp 97.9 F (36.6 C) (Oral)   Resp 16   SpO2 99%   Physical Exam  Constitutional: She is oriented to person, place, and time. She appears well-developed and well-nourished.  HENT:  Head: Normocephalic and atraumatic.  Mouth/Throat: Oropharynx is clear and moist.  Eyes: Conjunctivae and EOM are normal. Pupils are equal, round, and reactive to light.  Neck: Normal range of motion.  Cardiovascular: Normal rate, regular rhythm and normal heart sounds.   Pulmonary/Chest: Effort normal and breath sounds normal.  Abdominal: Soft. Bowel sounds are normal.  Musculoskeletal: Normal range of motion.  Exam somewhat limited due to body habitus Tenderness surrounding the patella, some crepitus noted with flexion/extension; no apparent bony deformities, effusion, or signs of trauma, ambulatory but limping to favor right leg No calf pain or swelling noted  Neurological: She is alert and oriented  to person, place, and time.  Skin: Skin is warm and dry.  Psychiatric: She has a normal mood and affect.  Nursing note and vitals reviewed.    ED Treatments / Results  Labs (all labs ordered are listed, but only abnormal results are displayed) Labs Reviewed - No data to display  EKG  EKG Interpretation None       Radiology Dg Knee Complete 4 Views Right  Result Date: 09/06/2016 CLINICAL DATA:  Right knee pain for 3 weeks.  History of arthritis. EXAM: RIGHT KNEE - COMPLETE 4+ VIEW COMPARISON:  None. FINDINGS: No evidence of fracture, dislocation, or joint  effusion. No evidence of arthropathy or other focal bone abnormality. Soft tissues are unremarkable. IMPRESSION: Negative. Electronically Signed   By: Burman NievesWilliam  Stevens M.D.   On: 09/06/2016 02:17    Procedures Procedures (including critical care time)  Medications Ordered in ED Medications - No data to display   Initial Impression / Assessment and Plan / ED Course  I have reviewed the triage vital signs and the nursing notes.  Pertinent labs & imaging results that were available during my care of the patient were reviewed by me and considered in my medical decision making (see chart for details).  36 year old female here with right knee pain. This appears to be a chronic issue. She has no acute bony deformity, swelling, or effusion noted on exam today. No calf pain or swelling suggestive of DVT. X-ray obtained which is negative for acute findings. Ace wrap was applied, will start on Modic. She will follow-up with her primary care doctor. work note given. Discussed plan with patient, she acknowledged understanding and agreed with plan of care.  Return precautions given for new or worsening symptoms.  Final Clinical Impressions(s) / ED Diagnoses   Final diagnoses:  Right knee pain, unspecified chronicity    New Prescriptions Discharge Medication List as of 09/06/2016  3:47 AM    START taking these medications   Details  meloxicam (MOBIC) 7.5 MG tablet Take 2 tablets (15 mg total) by mouth daily., Starting Sun 09/06/2016, Print         Garlon HatchetSanders, Melyssa Signor M, PA-C 09/06/16 [id number]    Azalia Bilisampos, Kevin, MD 09/06/16 [phone]

## 2016-09-06 NOTE — ED Triage Notes (Signed)
Pt presents to eD for assessment of right sided knee pain.  States she was seen by her PCP and given a daily medication she has been taking but is not working.  Patient states she is having difficulty walking on it and pain that will not allow her to sleep.

## 2016-09-11 ENCOUNTER — Other Ambulatory Visit: Payer: Self-pay | Admitting: Orthopedic Surgery

## 2016-09-11 DIAGNOSIS — M25561 Pain in right knee: Secondary | ICD-10-CM

## 2016-09-22 ENCOUNTER — Other Ambulatory Visit: Payer: Managed Care, Other (non HMO)

## 2018-06-19 IMAGING — CR DG KNEE COMPLETE 4+V*R*
4 series · 4 of 4 positions shown · non-contrast
Comparison: None.

CLINICAL DATA: Right knee pain for 3 weeks.  History of arthritis.

EXAM:
RIGHT KNEE - COMPLETE 4+ VIEW

[knee ap]
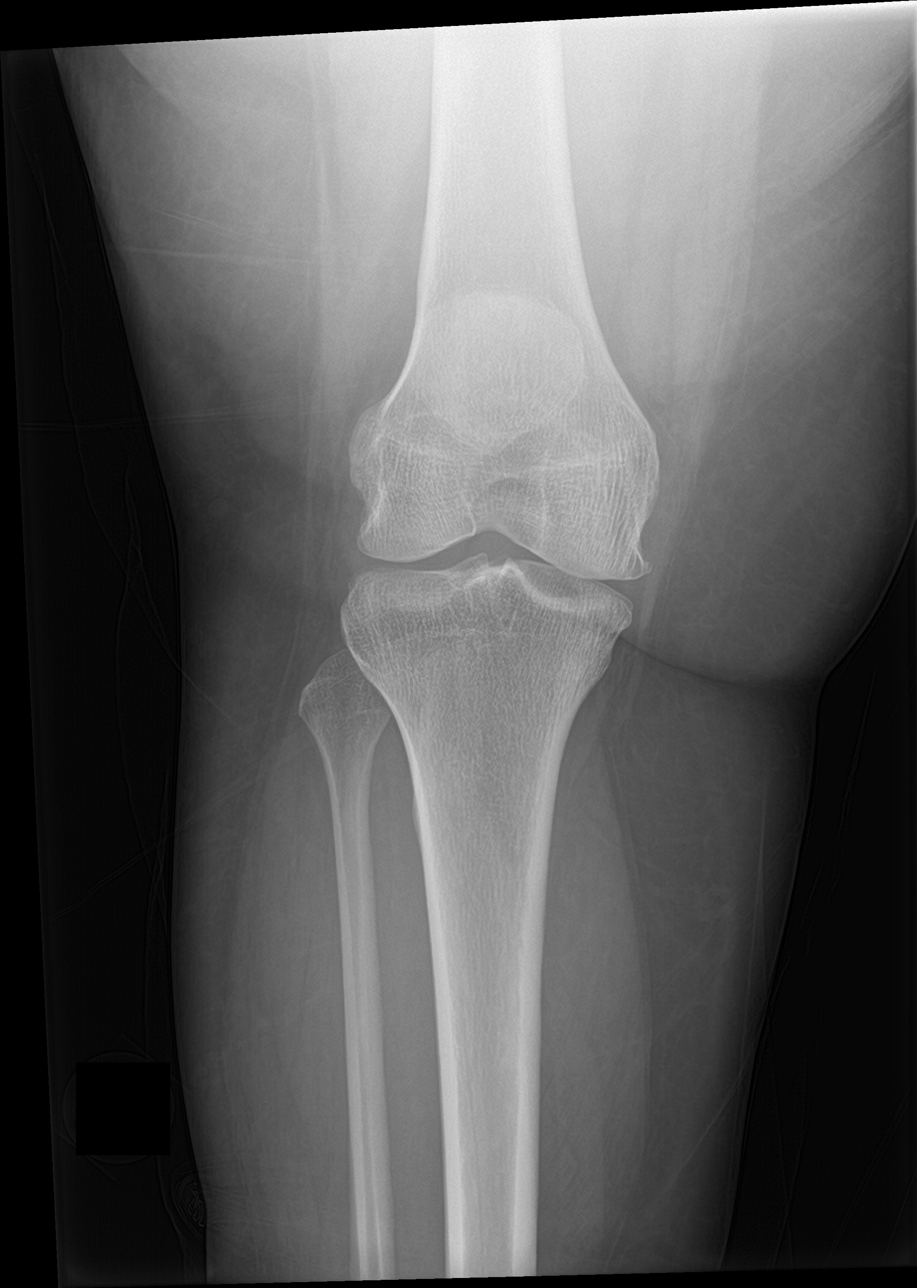

[knee lat]
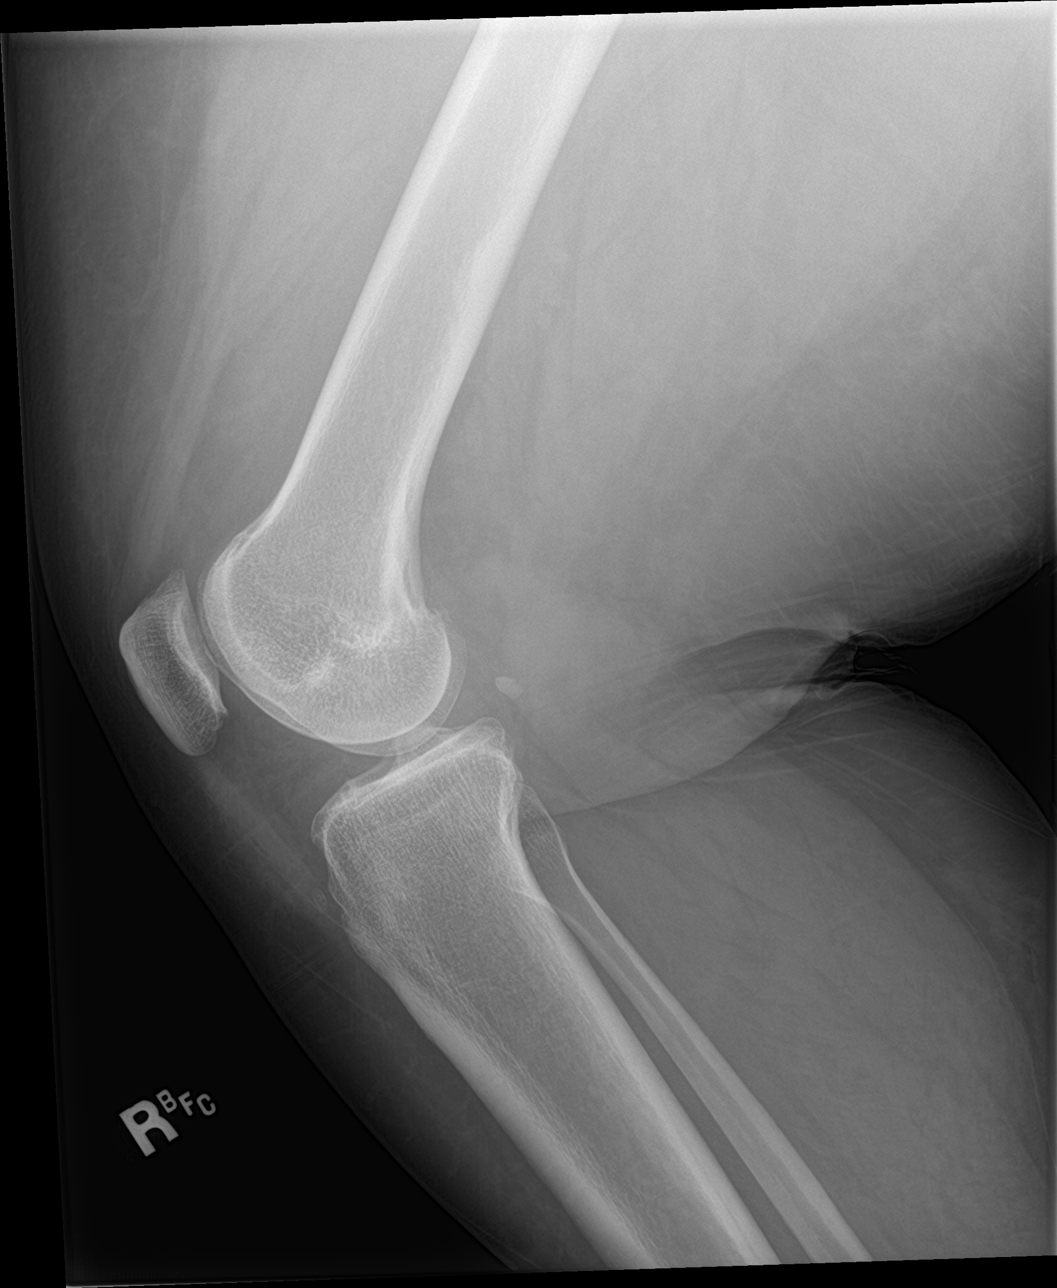

[knee obl (1 of 2)]
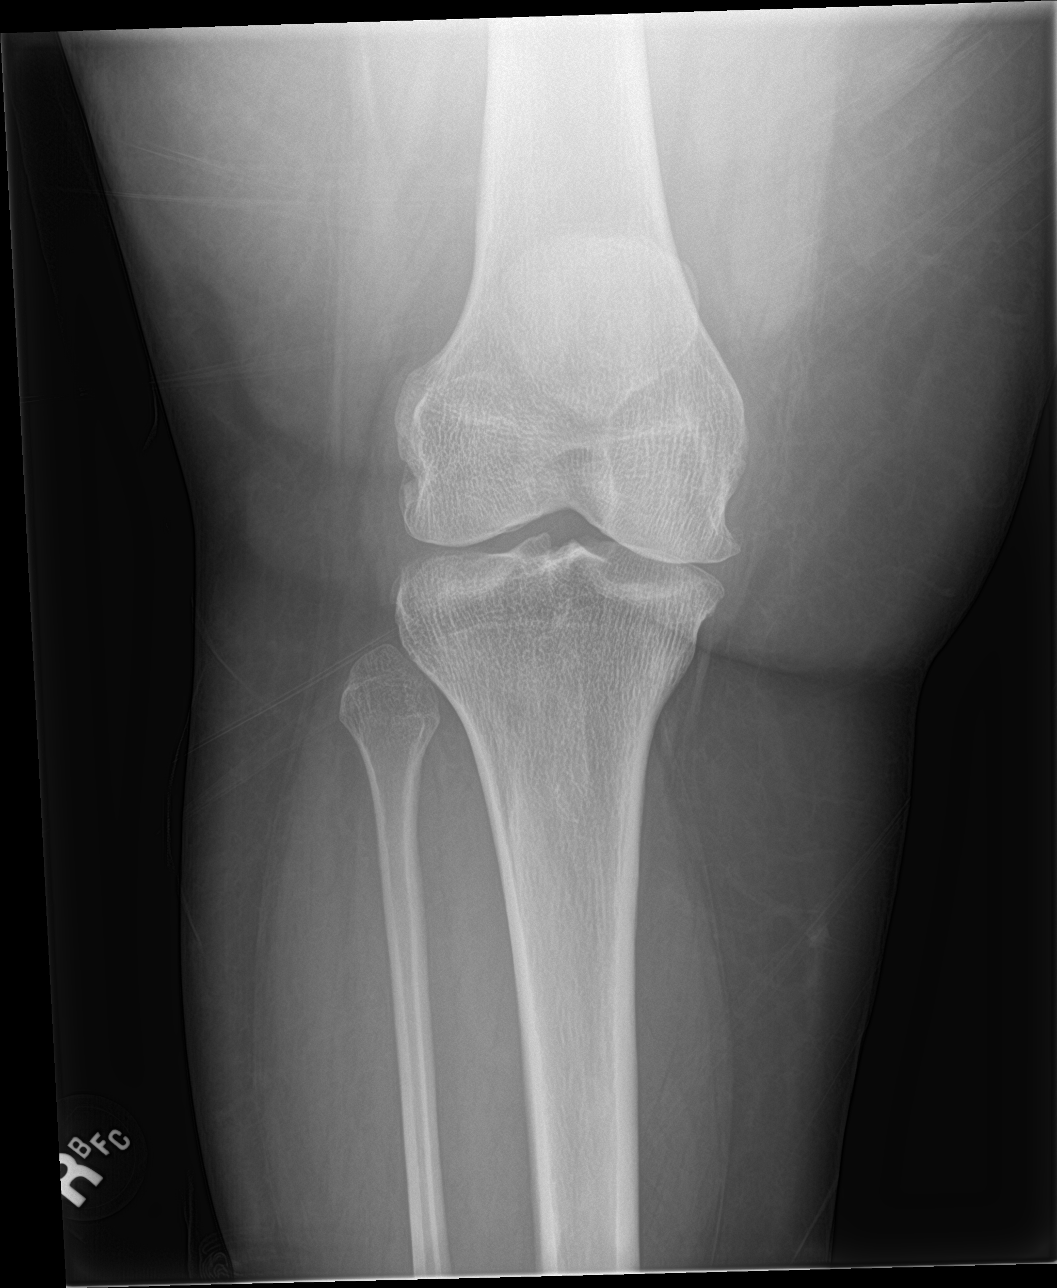

[knee obl (2 of 2)]
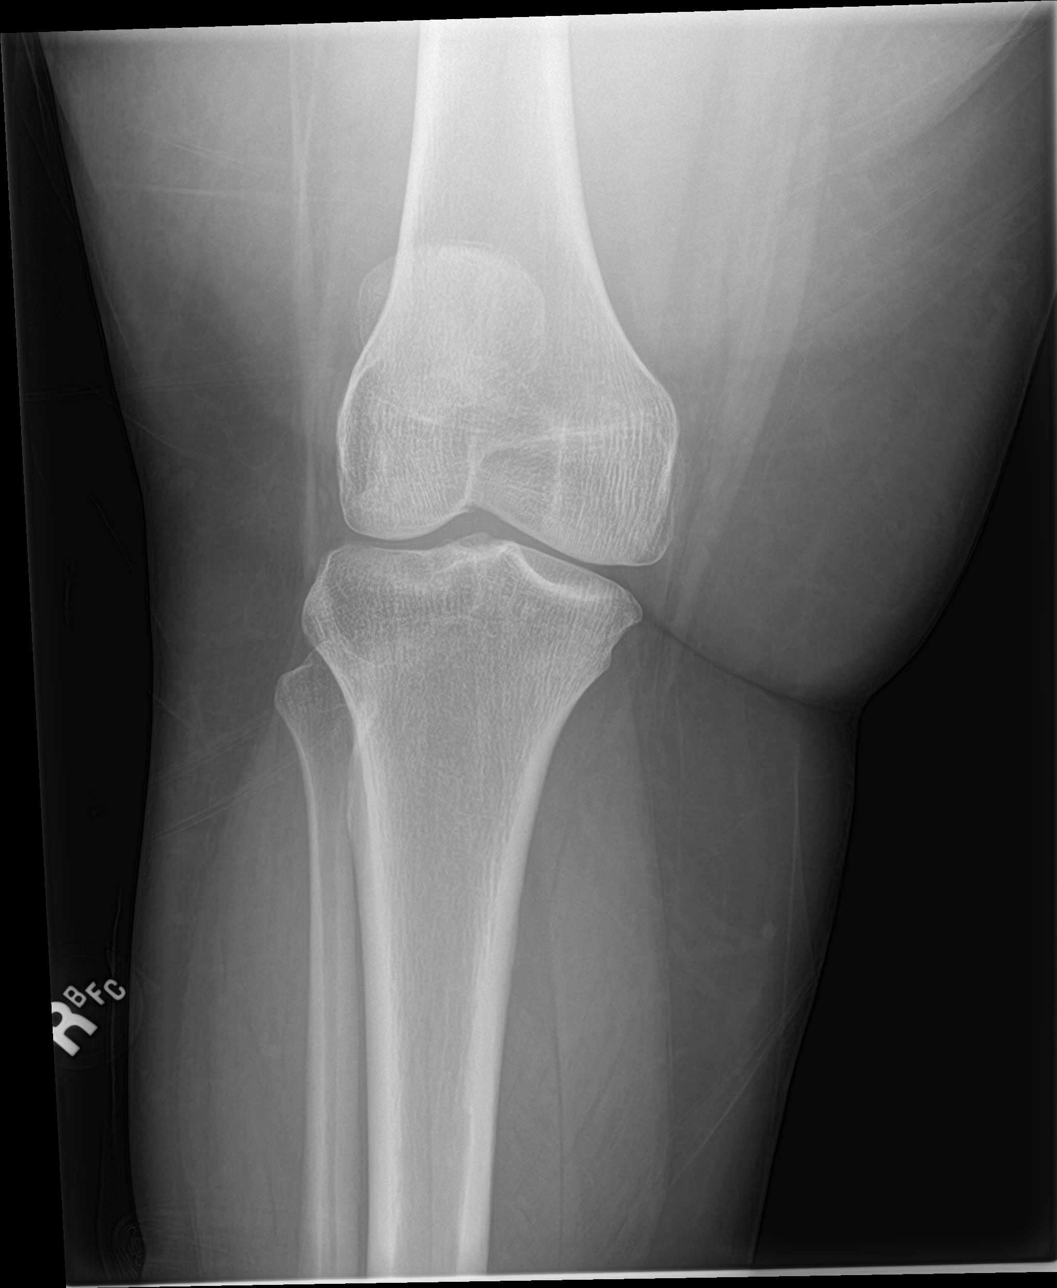

[4 of 4 positions shown; findings below may reference images not displayed]

FINDINGS: No evidence of fracture, dislocation, or joint effusion. No evidence
of arthropathy or other focal bone abnormality. Soft tissues are
unremarkable.
IMPRESSION: Negative.

## 2020-12-19 ENCOUNTER — Other Ambulatory Visit: Payer: Self-pay

## 2020-12-19 ENCOUNTER — Ambulatory Visit: Payer: Self-pay

## 2020-12-19 ENCOUNTER — Other Ambulatory Visit: Payer: Self-pay | Admitting: Occupational Medicine

## 2020-12-19 DIAGNOSIS — Z0000 Encounter for general adult medical examination without abnormal findings: Secondary | ICD-10-CM
